# Patient Record
Sex: Female | Born: 1995 | Race: White | Hispanic: No | Marital: Single | Smoking: Never smoker
Health system: Southern US, Community
[De-identification: ages and names within clinical notes are randomized; demographics above are authoritative.]

## PROBLEM LIST (undated history)

## (undated) DIAGNOSIS — J45909 Unspecified asthma, uncomplicated: Secondary | ICD-10-CM

## (undated) DIAGNOSIS — K900 Celiac disease: Secondary | ICD-10-CM

## (undated) HISTORY — PX: NASAL SINUS SURGERY: SHX719

## (undated) HISTORY — PX: TONSILLECTOMY: SUR1361

## (undated) HISTORY — PX: WISDOM TOOTH EXTRACTION: SHX21

---

## 2020-08-20 ENCOUNTER — Encounter (HOSPITAL_BASED_OUTPATIENT_CLINIC_OR_DEPARTMENT_OTHER): Payer: Self-pay | Admitting: Emergency Medicine

## 2020-08-20 ENCOUNTER — Emergency Department (HOSPITAL_BASED_OUTPATIENT_CLINIC_OR_DEPARTMENT_OTHER)
Admission: EM | Admit: 2020-08-20 | Discharge: 2020-08-20 | Disposition: A | Discharge status: Home / Self Care | Payer: BC Managed Care – PPO | Attending: Emergency Medicine | Admitting: Emergency Medicine

## 2020-08-20 ENCOUNTER — Other Ambulatory Visit: Payer: Self-pay

## 2020-08-20 ENCOUNTER — Emergency Department (HOSPITAL_BASED_OUTPATIENT_CLINIC_OR_DEPARTMENT_OTHER): Payer: BC Managed Care – PPO

## 2020-08-20 DIAGNOSIS — J45909 Unspecified asthma, uncomplicated: Secondary | ICD-10-CM | POA: Insufficient documentation

## 2020-08-20 DIAGNOSIS — R1084 Generalized abdominal pain: Secondary | ICD-10-CM | POA: Diagnosis not present

## 2020-08-20 DIAGNOSIS — R112 Nausea with vomiting, unspecified: Secondary | ICD-10-CM | POA: Insufficient documentation

## 2020-08-20 DIAGNOSIS — R1111 Vomiting without nausea: Secondary | ICD-10-CM

## 2020-08-20 HISTORY — DX: Unspecified asthma, uncomplicated: J45.909

## 2020-08-20 HISTORY — DX: Celiac disease: K90.0

## 2020-08-20 LAB — COMPREHENSIVE METABOLIC PANEL
ALT: 17 U/L (ref 0–44)
AST: 20 U/L (ref 15–41)
Albumin: 4.7 g/dL (ref 3.5–5.0)
Alkaline Phosphatase: 73 U/L (ref 38–126)
Anion gap: 12 (ref 5–15)
BUN: 18 mg/dL (ref 6–20)
CO2: 22 mmol/L (ref 22–32)
Calcium: 9.1 mg/dL (ref 8.9–10.3)
Chloride: 104 mmol/L (ref 98–111)
Creatinine, Ser: 0.77 mg/dL (ref 0.44–1.00)
GFR, Estimated: >60 mL/min (ref >60)
Glucose, Bld: 120 mg/dL — ABNORMAL HIGH (ref 70–99)
Potassium: 3.8 mmol/L (ref 3.5–5.1)
Sodium: 138 mmol/L (ref 135–145)
Total Bilirubin: 0.6 mg/dL (ref 0.3–1.2)
Total Protein: 7.7 g/dL (ref 6.5–8.1)

## 2020-08-20 LAB — PREGNANCY, URINE: Preg Test, Ur: NEGATIVE

## 2020-08-20 LAB — URINALYSIS, MICROSCOPIC (REFLEX)

## 2020-08-20 LAB — CBC
HCT: 41 % (ref 36.0–46.0)
Hemoglobin: 13.8 g/dL (ref 12.0–15.0)
MCH: 26.7 pg (ref 26.0–34.0)
MCHC: 33.7 g/dL (ref 30.0–36.0)
MCV: 79.5 fL — ABNORMAL LOW (ref 80.0–100.0)
Platelets: 392 10*3/uL (ref 150–400)
RBC: 5.16 MIL/uL — ABNORMAL HIGH (ref 3.87–5.11)
RDW: 13.7 % (ref 11.5–15.5)
WBC: 16.7 10*3/uL — ABNORMAL HIGH (ref 4.0–10.5)
nRBC: 0 % (ref 0.0–0.2)

## 2020-08-20 LAB — URINALYSIS, ROUTINE W REFLEX MICROSCOPIC
Bilirubin Urine: NEGATIVE
Glucose, UA: NEGATIVE mg/dL
Ketones, ur: NEGATIVE mg/dL
Leukocytes,Ua: NEGATIVE
Nitrite: NEGATIVE
Protein, ur: NEGATIVE mg/dL
Specific Gravity, Urine: 1.025 (ref 1.005–1.030)
pH: 6 (ref 5.0–8.0)

## 2020-08-20 LAB — LIPASE, BLOOD: Lipase: 31 U/L (ref 11–51)

## 2020-08-20 MED ORDER — SODIUM CHLORIDE 0.9 % IV BOLUS
1000.0000 mL | Freq: Once | INTRAVENOUS | Status: AC
  Administered 2020-08-20: 1000 mL via INTRAVENOUS

## 2020-08-20 MED ORDER — ONDANSETRON HCL 4 MG/2ML IJ SOLN
4.0000 mg | Freq: Once | INTRAMUSCULAR | Status: AC
  Administered 2020-08-20: 4 mg via INTRAVENOUS
  Filled 2020-08-20: qty 2

## 2020-08-20 MED ORDER — ONDANSETRON HCL 4 MG PO TABS: 4.0000 mg | ORAL_TABLET | Freq: Three times a day (TID) | ORAL | 0 refills | Status: AC | PRN

## 2020-08-20 MED ORDER — ONDANSETRON 4 MG PO TBDP
4.0000 mg | ORAL_TABLET | Freq: Once | ORAL | Status: AC | PRN
  Administered 2020-08-20: 4 mg via ORAL
  Filled 2020-08-20: qty 1

## 2020-08-20 MED ORDER — IOHEXOL 300 MG/ML  SOLN
100.0000 mL | Freq: Once | INTRAMUSCULAR | Status: AC
  Administered 2020-08-20: 80 mL via INTRAVENOUS

## 2020-08-20 NOTE — ED Triage Notes (Signed)
Reports n/v and abdominal pain that started during the night last night.  Reports she is unable to keep anything down.  History of celiac's disease.

## 2020-08-20 NOTE — ED Provider Notes (Signed)
[facility] EMERGENCY DEPARTMENT Provider Note   CSN: [id number] Arrival date & time: 08/20/20  1525     History Chief Complaint  Patient presents with  . Abdominal Pain  . Vomiting    Betty Miller is a 25 y.o. female.  The history is provided by the patient.  Abdominal Pain Pain location:  Generalized Pain quality: aching   Pain radiates to:  Does not radiate Pain severity:  Mild Onset quality:  Gradual Timing:  Intermittent Progression:  Waxing and waning Chronicity:  New Context: suspicious food intake   Relieved by:  Nothing Worsened by:  Nothing Associated symptoms: vomiting   Associated symptoms: no anorexia, no belching, no chest pain, no chills, no constipation, no cough, no diarrhea, no dysuria, no fever, no hematuria, no shortness of breath and no sore throat   Risk factors: has not had multiple surgeries and not pregnant        Past Medical History:  Diagnosis Date  . Asthma   . Celiac disease     There are no problems to display for this patient.   Past Surgical History:  Procedure Laterality Date  . NASAL SINUS SURGERY    . TONSILLECTOMY    . WISDOM TOOTH EXTRACTION       OB History   No obstetric history on file.     No family history on file.  Social History   Tobacco Use  . Smoking status: Never Smoker  . Smokeless tobacco: Never Used  Substance Use Topics  . Alcohol use: Yes    Comment: occasionally  . Drug use: Never    Home Medications Prior to Admission medications   Medication Sig Start Date End Date Taking? Authorizing Provider  ondansetron (ZOFRAN) 4 MG tablet Take 1 tablet (4 mg total) by mouth every 8 (eight) hours as needed for up to 15 doses for nausea or vomiting. 08/20/20  Yes Mychal Decarlo, DO    Allergies    Fish allergy and Sulfa antibiotics  Review of Systems   Review of Systems  Constitutional: Negative for chills and fever.  HENT: Negative for ear pain and sore throat.   Eyes: Negative for  pain and visual disturbance.  Respiratory: Negative for cough and shortness of breath.   Cardiovascular: Negative for chest pain and palpitations.  Gastrointestinal: Positive for abdominal pain and vomiting. Negative for anorexia, constipation and diarrhea.  Genitourinary: Negative for dysuria and hematuria.  Musculoskeletal: Negative for arthralgias and back pain.  Skin: Negative for color change and rash.  Neurological: Negative for seizures and syncope.  All other systems reviewed and are negative.   Physical Exam Updated Vital Signs BP 106/68   Pulse 95   Temp 98.8 F (37.1 C) (Oral)   Resp 15   Ht 5\' 3"  (1.6 m)   Wt 54.4 kg   SpO2 93%   BMI 21.26 kg/m   Physical Exam Vitals and nursing note reviewed.  Constitutional:      General: She is not in acute distress.    Appearance: She is well-developed and well-nourished. She is not ill-appearing.  HENT:     Head: Normocephalic and atraumatic.     Mouth/Throat:     Mouth: Mucous membranes are moist.     Pharynx: Oropharynx is clear.  Eyes:     Extraocular Movements: Extraocular movements intact.     Conjunctiva/sclera: Conjunctivae normal.  Cardiovascular:     Rate and Rhythm: Normal rate and regular rhythm.     Heart sounds:  Normal heart sounds. No murmur heard.   Pulmonary:     Effort: Pulmonary effort is normal. No respiratory distress.     Breath sounds: Normal breath sounds.  Abdominal:     Palpations: Abdomen is soft.     Tenderness: There is generalized abdominal tenderness. There is guarding.  Musculoskeletal:        General: No edema.     Cervical back: Neck supple.  Skin:    General: Skin is warm and dry.     Capillary Refill: Capillary refill takes less than 2 seconds.  Neurological:     General: No focal deficit present.     Mental Status: She is alert.  Psychiatric:        Mood and Affect: Mood and affect normal.     ED Results / Procedures / Treatments   Labs (all labs ordered are listed,  but only abnormal results are displayed) Labs Reviewed  COMPREHENSIVE METABOLIC PANEL - Abnormal; Notable for the following components:      Result Value   Glucose, Bld 120 (*)    All other components within normal limits  CBC - Abnormal; Notable for the following components:   WBC 16.7 (*)    RBC 5.16 (*)    MCV 79.5 (*)    All other components within normal limits  URINALYSIS, ROUTINE W REFLEX MICROSCOPIC - Abnormal; Notable for the following components:   APPearance HAZY (*)    Hgb urine dipstick MODERATE (*)    All other components within normal limits  URINALYSIS, MICROSCOPIC (REFLEX) - Abnormal; Notable for the following components:   Bacteria, UA MANY (*)    All other components within normal limits  LIPASE, BLOOD  PREGNANCY, URINE    EKG None  Radiology CT ABDOMEN PELVIS W CONTRAST  Result Date: 08/20/2020 CLINICAL DATA:  25 year old female with abdominal distension. EXAM: CT ABDOMEN AND PELVIS WITH CONTRAST TECHNIQUE: Multidetector CT imaging of the abdomen and pelvis was performed using the standard protocol following bolus administration of intravenous contrast. CONTRAST:  49mL OMNIPAQUE IOHEXOL 300 MG/ML  SOLN COMPARISON:  None. FINDINGS: Lower chest: The visualized lung bases are clear. No intra-abdominal free air or free fluid. Hepatobiliary: No focal liver abnormality is seen. No gallstones, gallbladder wall thickening, or biliary dilatation. Pancreas: Unremarkable. No pancreatic ductal dilatation or surrounding inflammatory changes. Spleen: Normal in size without focal abnormality. Adrenals/Urinary Tract: The adrenal glands unremarkable. The kidneys, visualized ureters, and urinary bladder appear unremarkable. Stomach/Bowel: Loose stool noted throughout the colon. There is no bowel obstruction or active inflammation. The appendix is normal. Vascular/Lymphatic: The abdominal aorta and IVC unremarkable. No portal venous gas. There is no adenopathy. Reproductive: The uterus is  anteverted and grossly unremarkable. No adnexal masses. Other: None Musculoskeletal: No acute or significant osseous findings. IMPRESSION: Diarrheal state. Correlation with clinical exam and stool cultures recommended. No bowel obstruction. Normal appendix. Electronically Signed   By: Elgie Collard M.D.   On: 08/20/2020 19:35    Procedures Procedures (including critical care time)  Medications Ordered in ED Medications  ondansetron (ZOFRAN-ODT) disintegrating tablet 4 mg (4 mg Oral Given 08/20/20 1554)  sodium chloride 0.9 % bolus 1,000 mL (0 mLs Intravenous Stopped 08/20/20 2009)  ondansetron (ZOFRAN) injection 4 mg (4 mg Intravenous Given 08/20/20 1854)  iohexol (OMNIPAQUE) 300 MG/ML solution 100 mL (80 mLs Intravenous Contrast Given 08/20/20 1916)    ED Course  I have reviewed the triage vital signs and the nursing notes.  Pertinent labs & imaging results that  were available during my care of the patient were reviewed by me and considered in my medical decision making (see chart for details).    MDM Rules/Calculators/A&P                          Amyrah Pinkhasov is a 24 year old female with no significant medical history presents the ED with nausea vomiting, abdominal pain.  Suspect possibly suspicious food intake.  Normal vitals.  No fever.  Has some lower abdominal tenderness on exam.  Concern for possible colitis versus appendicitis versus food poisoning.  Lab work and CT scan imaging ordered.  Patient given IV fluids and IV Zofran.  No significant anemia, electrolyte abnormality, kidney injury.  Negative pregnancy test and doubt ectopic pregnancy.  No UTI.  CT scan showed no appendicitis, no colitis, no bowel obstruction.  Overall suspect food poisoning.  Felt better after IV fluids.  Recommend bland diet and given prescription for Zofran.  Understands return precautions.  This chart was dictated using voice recognition software.  Despite best efforts to proofread,  errors can occur which  can change the documentation meaning.    Final Clinical Impression(s) / ED Diagnoses Final diagnoses:  Generalized abdominal pain  Vomiting without nausea, intractability of vomiting not specified, unspecified vomiting type    Rx / DC Orders ED Discharge Orders         Ordered    ondansetron (ZOFRAN) 4 MG tablet  Every 8 hours PRN        08/20/20 2023           Virgina Norfolk, DO 08/20/20 2025

## 2021-06-15 ENCOUNTER — Other Ambulatory Visit: Payer: Self-pay

## 2021-06-15 ENCOUNTER — Emergency Department: Admit: 2021-06-15 | Payer: Self-pay

## 2021-06-15 ENCOUNTER — Emergency Department
Admission: EM | Admit: 2021-06-15 | Discharge: 2021-06-15 | Disposition: A | Discharge status: Home / Self Care | Payer: BC Managed Care – PPO | Source: Home / Self Care

## 2021-06-15 DIAGNOSIS — J069 Acute upper respiratory infection, unspecified: Secondary | ICD-10-CM | POA: Diagnosis not present

## 2021-06-15 DIAGNOSIS — H66001 Acute suppurative otitis media without spontaneous rupture of ear drum, right ear: Secondary | ICD-10-CM

## 2021-06-15 MED ORDER — AMOXICILLIN 875 MG PO TABS: 875.0000 mg | ORAL_TABLET | Freq: Two times a day (BID) | ORAL | 0 refills | Status: AC

## 2021-06-15 MED ORDER — FLUTICASONE PROPIONATE 50 MCG/ACT NA SUSP: 2.0000 | Freq: Every day | NASAL | 0 refills | Status: AC

## 2021-06-15 NOTE — ED Provider Notes (Signed)
[facility]    CSN: [id number] Arrival date & time: 06/15/21  1410      History   Chief Complaint Chief Complaint  Patient presents with   Otalgia    Rt ear   Nasal Congestion   Headache    HPI Betty Miller is a 25 y.o. female.   HPI  Patient has had cough cold runny nose and sinus congestion for over a week.  Now has severe right ear pain.  This morning woke up with laryngitis.  No fever or chills.  No body aches  Past Medical History:  Diagnosis Date   Asthma    Celiac disease     There are no problems to display for this patient.   Past Surgical History:  Procedure Laterality Date   NASAL SINUS SURGERY     TONSILLECTOMY     WISDOM TOOTH EXTRACTION      OB History   No obstetric history on file.      Home Medications    Prior to Admission medications   Medication Sig Start Date End Date Taking? Authorizing Provider  amoxicillin (AMOXIL) 875 MG tablet Take 1 tablet (875 mg total) by mouth 2 (two) times daily for 10 days. 06/15/21 06/25/21 Yes Raylene Everts, MD  dicyclomine (BENTYL) 10 MG capsule Take 10 mg by mouth 2 (two) times daily.   Yes [provider]  Etonogestrel (NEXPLANON [place]) Inject into the skin.   Yes [provider]  fluticasone (FLONASE) 50 MCG/ACT nasal spray Place 2 sprays into both nostrils daily. 06/15/21  Yes Raylene Everts, MD  Fluticasone-Umeclidin-Vilant (TRELEGY ELLIPTA) 100-62.5-25 MCG/ACT AEPB Inhale into the lungs.   Yes [provider]  gabapentin (NEURONTIN) 300 MG capsule Take 300 mg by mouth 2 (two) times daily.   Yes [provider]  ipratropium-albuterol (DUONEB) 0.5-2.5 (3) MG/3ML SOLN Take 3 mLs by nebulization.   Yes [provider]  montelukast (SINGULAIR) 10 MG tablet Take 10 mg by mouth at bedtime.   Yes [provider]  omeprazole (PRILOSEC) 20 MG capsule Take 20 mg by mouth daily.   Yes [provider]  sertraline (ZOLOFT) 100 MG  tablet Take 125 mg by mouth daily.   Yes [provider]  ondansetron (ZOFRAN) 4 MG tablet Take 1 tablet (4 mg total) by mouth every 8 (eight) hours as needed for up to 15 doses for nausea or vomiting. Patient not taking: Reported on 06/15/2021 08/20/20   Betty Sites, DO    Family History History reviewed. No pertinent family history.  Social History Social History   Tobacco Use   Smoking status: Never   Smokeless tobacco: Never  Substance Use Topics   Alcohol use: Yes    Comment: occasionally   Drug use: Never     Allergies   Fish allergy, Shellfish allergy, Gluten meal, and Sulfa antibiotics   Review of Systems Review of Systems See HPI  Physical Exam Triage Vital Signs ED Triage Vitals  Enc Vitals Group     BP 06/15/21 1508 110/74     Pulse Rate 06/15/21 1508 81     Resp 06/15/21 1508 14     Temp 06/15/21 1508 98 F (36.7 C)     Temp Source 06/15/21 1508 Oral     SpO2 06/15/21 1508 99 %     Weight --      Height --      Head Circumference --      Peak Flow --  Pain Score 06/15/21 1510 2     Pain Loc --      Pain Edu? --      Excl. in [place]? --    No data found.  Updated Vital Signs BP 110/74 (BP Location: Left Arm)   Pulse 81   Temp 98 F (36.7 C) (Oral)   Resp 14   SpO2 99%       Physical Exam Constitutional:      General: She is not in acute distress.    Appearance: She is well-developed and normal weight.  HENT:     Head: Normocephalic and atraumatic.     Right Ear: Ear canal and external ear normal.     Left Ear: Ear canal and external ear normal.     Ears:     Comments: Both TMs are dull, right with moderate injection    Nose: Congestion present.     Mouth/Throat:     Pharynx: No posterior oropharyngeal erythema.     Comments: Tonsils surgically absent Eyes:     Conjunctiva/sclera: Conjunctivae normal.     Pupils: Pupils are equal, round, and reactive to light.  Cardiovascular:     Rate and Rhythm: Normal rate and  regular rhythm.     Heart sounds: Normal heart sounds.  Pulmonary:     Effort: Pulmonary effort is normal. No respiratory distress.     Breath sounds: Normal breath sounds.  Abdominal:     General: There is no distension.     Palpations: Abdomen is soft.  Musculoskeletal:        General: Normal range of motion.     Cervical back: Normal range of motion and neck supple.  Lymphadenopathy:     Cervical: No cervical adenopathy.  Skin:    General: Skin is warm and dry.  Neurological:     Mental Status: She is alert.  Psychiatric:        Mood and Affect: Mood normal.        Behavior: Behavior normal.     UC Treatments / Results  Labs (all labs ordered are listed, but only abnormal results are displayed) Labs Reviewed - No data to display  EKG   Radiology No results found.  Procedures Procedures (including critical care time)  Medications Ordered in UC Medications - No data to display  Initial Impression / Assessment and Plan / UC Course  I have reviewed the triage vital signs and the nursing notes.  Pertinent labs & imaging results that were available during my care of the patient were reviewed by me and considered in my medical decision making (see chart for details).     Final Clinical Impressions(s) / UC Diagnoses   Final diagnoses:  Non-recurrent acute suppurative otitis media of right ear without spontaneous rupture of tympanic membrane  Viral upper respiratory tract infection     Discharge Instructions      Use of Flonase twice a day for the first 2 to 3 days then once a day until your symptoms have resolved Take antibiotic 2 times a day for 7 to 10 days.  May stop when symptoms have resolved would Drink lots of fluids     ED Prescriptions     Medication Sig Dispense Auth. Provider   amoxicillin (AMOXIL) 875 MG tablet Take 1 tablet (875 mg total) by mouth 2 (two) times daily for 10 days. 20 tablet Raylene Everts, MD   fluticasone [facility]) 50  MCG/ACT nasal spray Place 2 sprays into both  nostrils daily. 16 g Eustace Moore, MD      PDMP not reviewed this encounter.   Eustace Moore, MD 06/15/21 [phone]

## 2021-06-15 NOTE — ED Triage Notes (Signed)
Pt presents with rt ear pain, nasal congestion, and headache x 1 week

## 2021-06-15 NOTE — Discharge Instructions (Signed)
Use of Flonase twice a day for the first 2 to 3 days then once a day until your symptoms have resolved Take antibiotic 2 times a day for 7 to 10 days.  May stop when symptoms have resolved would Drink lots of fluids

## 2022-05-14 IMAGING — CT CT ABD-PELV W/ CM
2 of 4 series · 16 of 46 positions shown, 18 images · IV contrast (Omnipaque)
Comparison: None.

CLINICAL DATA: 24-year-old female with abdominal distension.

EXAM:
CT ABDOMEN AND PELVIS WITH CONTRAST
TECHNIQUE: Multidetector CT imaging of the abdomen and pelvis was performed
using the standard protocol following bolus administration of
intravenous contrast.
CONTRAST:  80mL OMNIPAQUE IOHEXOL 300 MG/ML  SOLN

[Series 2: axial st · axial · 0.65mm/px · z∈[-282,+138]mm · 13 of 92 slices shown, 15 images]
[im 4/92  soft-tissue]
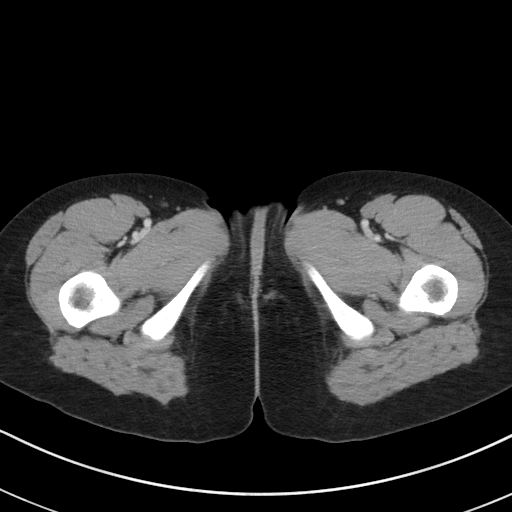
[im 4/92  bone]
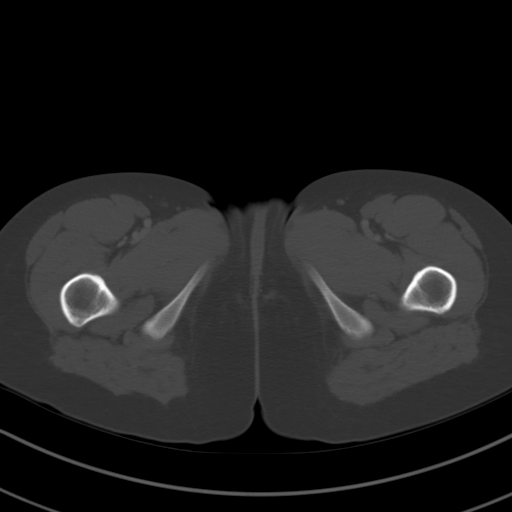
[im 12/92  soft-tissue]
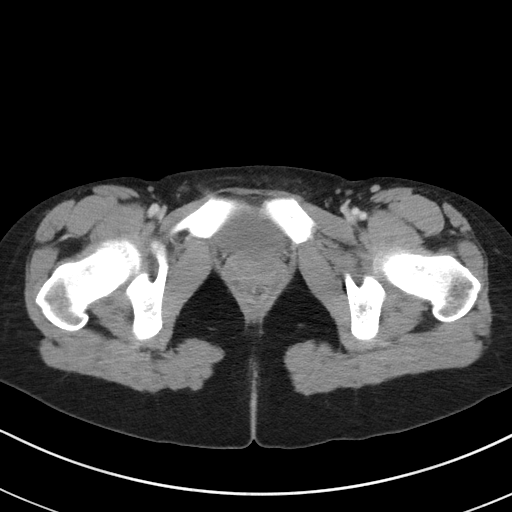
[im 19/92  soft-tissue]
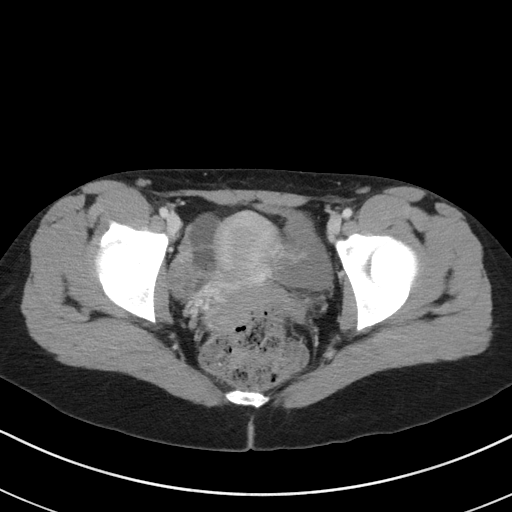
[im 27/92  soft-tissue]
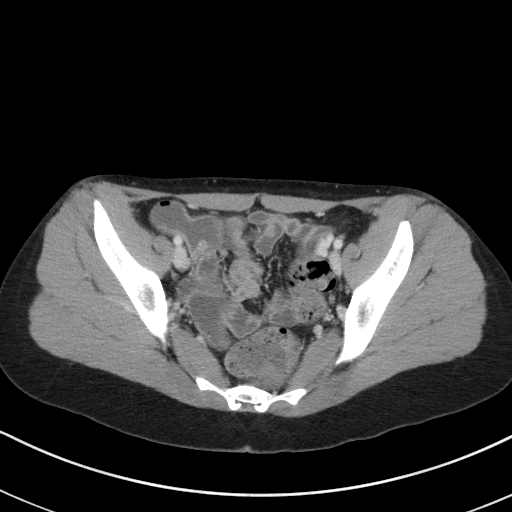
[im 31/92  soft-tissue]
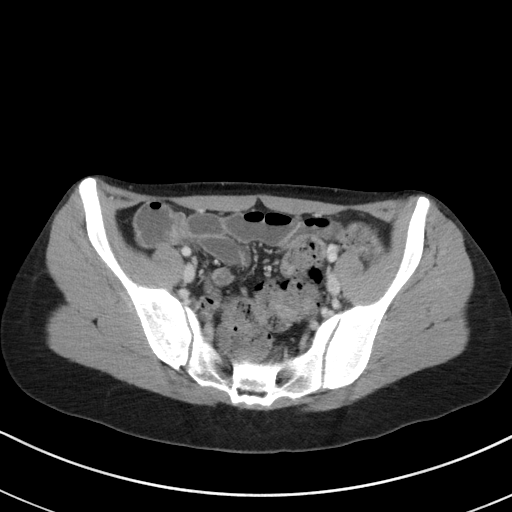
[im 38/92  soft-tissue]
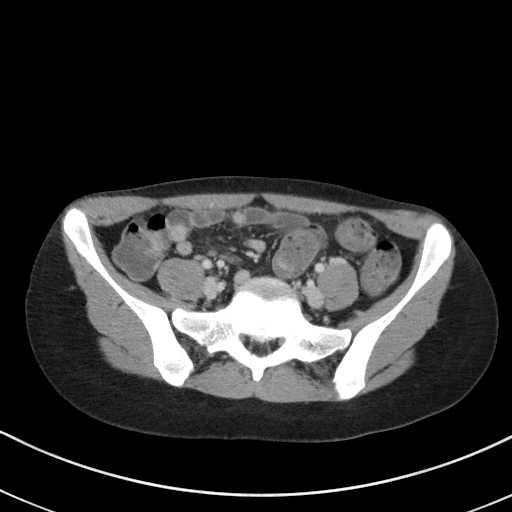
[im 46/92  soft-tissue]
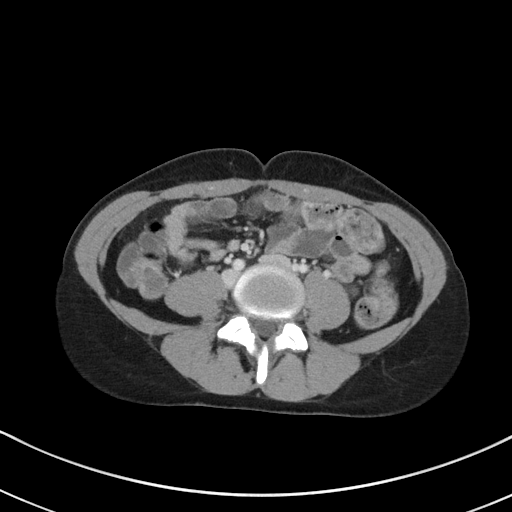
[im 54/92  soft-tissue]
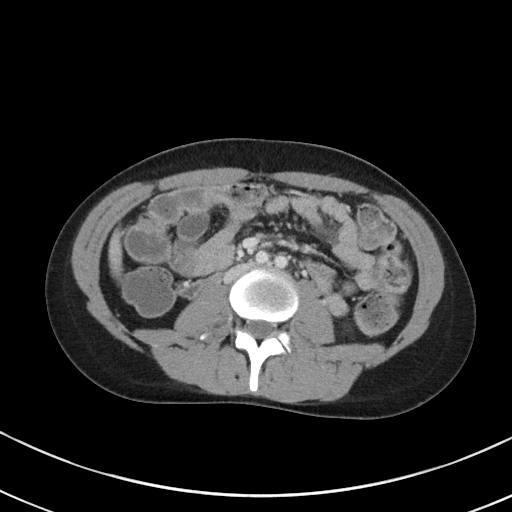
[im 61/92  soft-tissue]
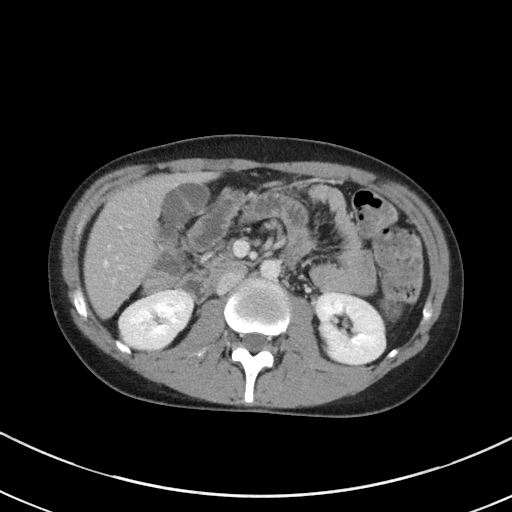
[im 61/92  bone]
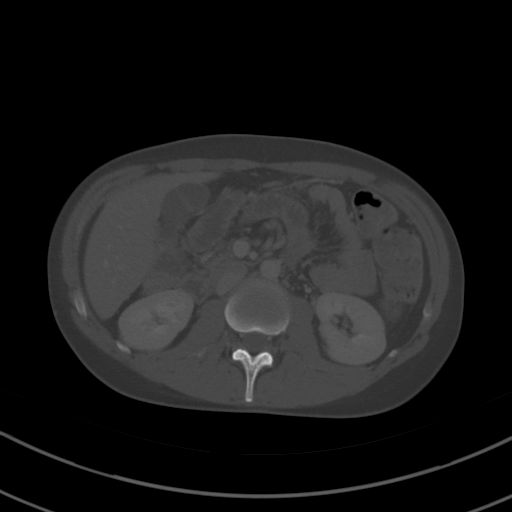
[im 65/92  soft-tissue]
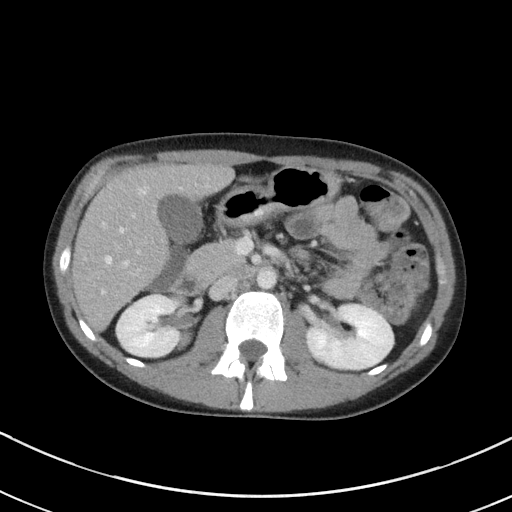
[im 73/92  soft-tissue]
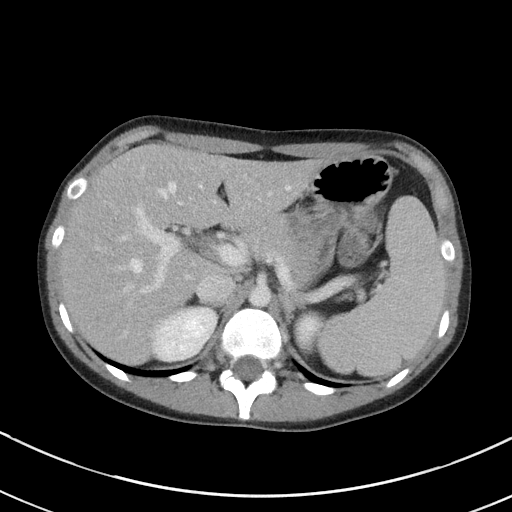
[im 80/92  soft-tissue]
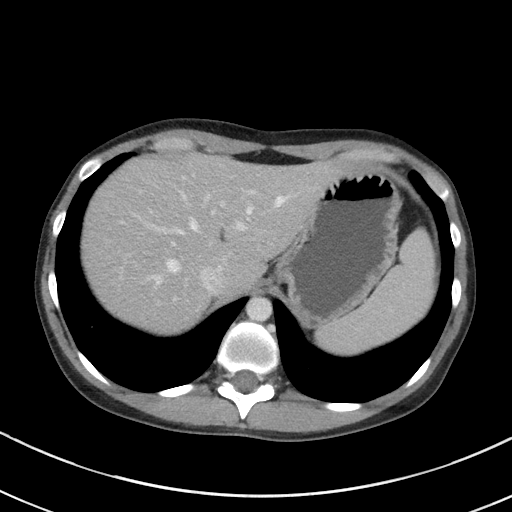
[im 88/92  soft-tissue]
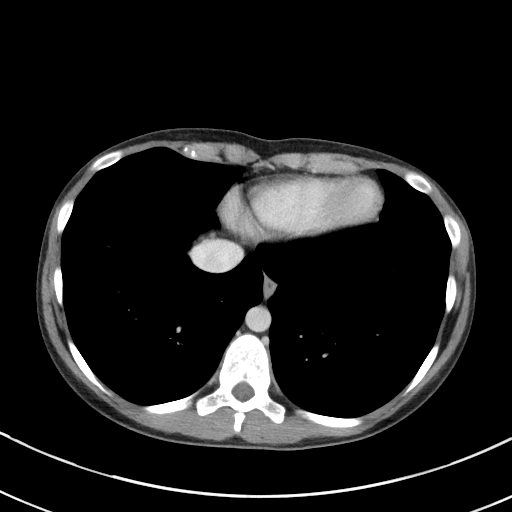

[Series 5: coronal st · coronal · 0.75mm/px · 3 of 80 slices shown]
[im 27/80  soft-tissue]
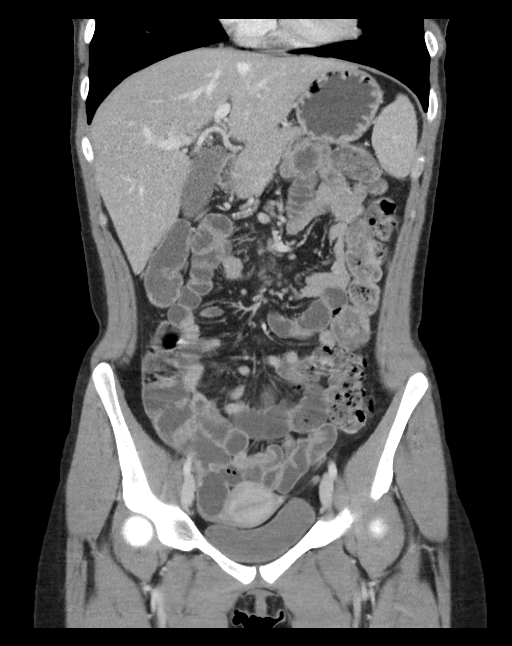
[im 36/80  soft-tissue]
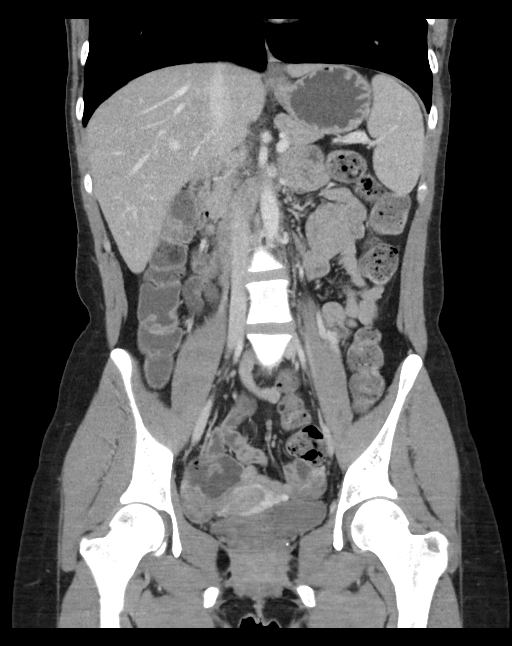
[im 44/80  soft-tissue]
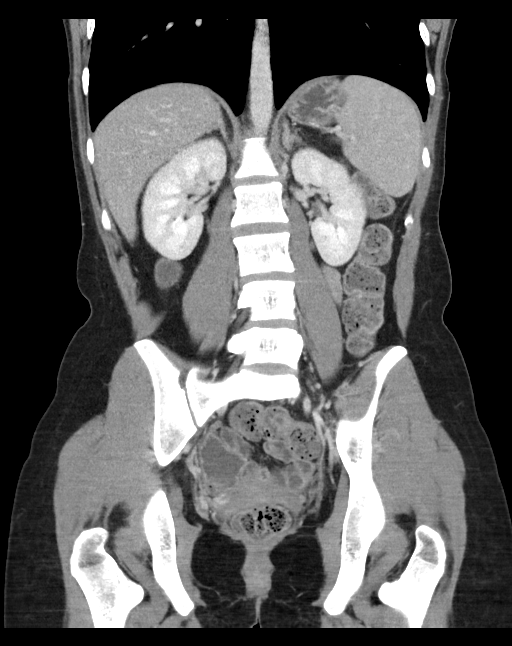

[16 of 46 positions shown; findings below may reference images not displayed]

FINDINGS: Lower chest: The visualized lung bases are clear.

No intra-abdominal free air or free fluid.

Hepatobiliary: No focal liver abnormality is seen. No gallstones,
gallbladder wall thickening, or biliary dilatation.

Pancreas: Unremarkable. No pancreatic ductal dilatation or
surrounding inflammatory changes.

Spleen: Normal in size without focal abnormality.

Adrenals/Urinary Tract: The adrenal glands unremarkable. The
kidneys, visualized ureters, and urinary bladder appear
unremarkable.

Stomach/Bowel: Loose stool noted throughout the colon. There is no
bowel obstruction or active inflammation. The appendix is normal.

Vascular/Lymphatic: The abdominal aorta and IVC unremarkable. No
portal venous gas. There is no adenopathy.

Reproductive: The uterus is anteverted and grossly unremarkable. No
adnexal masses.

Other: None

Musculoskeletal: No acute or significant osseous findings.
IMPRESSION: Diarrheal state. Correlation with clinical exam and stool cultures
recommended. No bowel obstruction. Normal appendix.
